# Patient Record
Sex: Female | Born: 1950 | Race: White | Hispanic: No | Marital: Married | Smoking: Never smoker
Health system: Southern US, Community
[De-identification: ages and names within clinical notes are randomized; demographics above are authoritative.]

## PROBLEM LIST (undated history)

## (undated) DIAGNOSIS — F419 Anxiety disorder, unspecified: Secondary | ICD-10-CM

## (undated) DIAGNOSIS — K219 Gastro-esophageal reflux disease without esophagitis: Secondary | ICD-10-CM

## (undated) DIAGNOSIS — M1990 Unspecified osteoarthritis, unspecified site: Secondary | ICD-10-CM

## (undated) DIAGNOSIS — J45909 Unspecified asthma, uncomplicated: Secondary | ICD-10-CM

---

## 1988-10-09 HISTORY — PX: BUNIONECTOMY: SHX129

## 1991-10-10 HISTORY — PX: HAMMER TOE SURGERY: SHX385

## 1994-10-09 HISTORY — PX: FOOT SURGERY: SHX648

## 1996-10-09 HISTORY — PX: ABDOMINAL HYSTERECTOMY: SUR658

## 1998-08-12 ENCOUNTER — Other Ambulatory Visit: Admission: RE | Admit: 1998-08-12 | Discharge: 1998-08-12 | Payer: Self-pay | Admitting: Vascular Surgery

## 1999-08-08 ENCOUNTER — Other Ambulatory Visit: Admission: RE | Admit: 1999-08-08 | Discharge: 1999-08-08 | Payer: Self-pay | Admitting: Orthopedic Surgery

## 2000-01-27 ENCOUNTER — Encounter: Admission: RE | Admit: 2000-01-27 | Discharge: 2000-01-27 | Payer: Self-pay | Admitting: *Deleted

## 2000-10-15 ENCOUNTER — Inpatient Hospital Stay (HOSPITAL_COMMUNITY): Admission: EM | Admit: 2000-10-15 | Discharge: 2000-10-19 | Payer: Self-pay | Admitting: Emergency Medicine

## 2000-10-15 ENCOUNTER — Encounter (HOSPITAL_BASED_OUTPATIENT_CLINIC_OR_DEPARTMENT_OTHER): Payer: Self-pay | Admitting: Internal Medicine

## 2000-10-16 ENCOUNTER — Encounter (HOSPITAL_BASED_OUTPATIENT_CLINIC_OR_DEPARTMENT_OTHER): Payer: Self-pay | Admitting: Internal Medicine

## 2000-10-17 ENCOUNTER — Encounter (HOSPITAL_BASED_OUTPATIENT_CLINIC_OR_DEPARTMENT_OTHER): Payer: Self-pay | Admitting: Internal Medicine

## 2000-10-18 ENCOUNTER — Encounter (HOSPITAL_BASED_OUTPATIENT_CLINIC_OR_DEPARTMENT_OTHER): Payer: Self-pay | Admitting: Internal Medicine

## 2001-02-20 ENCOUNTER — Encounter: Admission: RE | Admit: 2001-02-20 | Discharge: 2001-02-20 | Payer: Self-pay | Admitting: *Deleted

## 2001-10-09 HISTORY — PX: GALLBLADDER SURGERY: SHX652

## 2010-10-09 HISTORY — PX: KNEE ARTHROPLASTY: SHX992

## 2013-10-09 HISTORY — PX: KNEE ARTHROPLASTY: SHX992

## 2019-10-10 DIAGNOSIS — C801 Malignant (primary) neoplasm, unspecified: Secondary | ICD-10-CM

## 2019-10-10 HISTORY — DX: Malignant (primary) neoplasm, unspecified: C80.1

## 2019-10-10 HISTORY — PX: BREAST LUMPECTOMY WITH AXILLARY LYMPH NODE DISSECTION: SHX5756

## 2022-02-17 ENCOUNTER — Other Ambulatory Visit: Payer: Self-pay | Admitting: Orthopedic Surgery

## 2022-03-03 NOTE — Patient Instructions (Addendum)
DUE TO COVID-19 ONLY TWO VISITORS  (aged 70 and older)  ARE ALLOWED TO COME WITH YOU AND STAY IN THE WAITING ROOM ONLY DURING PRE OP AND PROCEDURE.   **NO VISITORS ARE ALLOWED IN THE SHORT STAY AREA OR RECOVERY ROOM!!**  IF YOU WILL BE ADMITTED INTO THE HOSPITAL YOU ARE ALLOWED ONLY FOUR SUPPORT PEOPLE DURING VISITATION HOURS ONLY (7 AM -8PM)   The support person(s) must pass our screening, gel in and out, and wear a mask at all times, including in the patient's room. Patients must also wear a mask when staff or their support person are in the room. Visitors GUEST BADGE MUST BE WORN VISIBLY  One adult visitor may remain with you overnight and MUST be in the room by 8 P.M.     Your procedure is scheduled on: 03/09/22   Report to [facility] Main Entrance    Report to  short stay at 5:15 AM   Call this number if you have problems the morning of surgery [phone]   Do not eat food :After Midnight.   After Midnight you may have the following liquids until __4:30_ AM/  DAY OF SURGERY  Water Black Coffee (sugar ok, NO MILK/CREAM OR CREAMERS)  Tea (sugar ok, NO MILK/CREAM OR CREAMERS) regular and decaf                             Plain Jell-O (NO RED)                                           Fruit ices (not with fruit pulp, NO RED)                                     Popsicles (NO RED)                                                                  Juice: apple, WHITE grape, WHITE cranberry Sports drinks like Gatorade (NO RED) Clear broth(vegetable,chicken,beef)                    The day of surgery:  Drink ONE (1) Pre-Surgery Clear Ensure  at  4:15 AM the morning of surgery. Drink in one sitting. Do not sip.  This drink was given to you during your hospital  pre-op appointment visit. Nothing else to drink after completing the  Pre-Surgery Clear Ensure at 4:30  am          If you have questions, please contact your surgeon's office.   FOLLOW BOWEL PREP AND ANY  ADDITIONAL PRE OP INSTRUCTIONS YOU RECEIVED FROM YOUR SURGEON'S OFFICE!!!     Oral Hygiene is also important to reduce your risk of infection.                                    Remember - BRUSH YOUR TEETH THE MORNING OF SURGERY WITH YOUR REGULAR TOOTHPASTE   Do  NOT smoke after Midnight   Take these medicines the morning of surgery with A SIP OF WATER: Clonazepam if needed, Gabapentin, Venlafaxine, Propranolol, Omeprazole                                You may not have any metal on your body including hair pins, jewelry, and body piercing             Do not wear make-up, lotions, powders, perfumes/cologne, or deodorant  Do not wear nail polish including gel and S&S, artificial/acrylic nails, or any other type of covering on natural nails including finger and toenails. If you have artificial nails, gel coating, etc. that needs to be removed by a nail salon please have this removed prior to surgery or surgery may need to be canceled/ delayed if the surgeon/ anesthesia feels like they are unable to be safely monitored.   Do not shave  48 hours prior to surgery.               Men may shave face and neck.   Do not bring valuables to the hospital. [place].   Contacts, dentures or bridgework may not be worn into surgery. .    Patients discharged on the day of surgery will not be allowed to drive home.  Someone NEEDS to stay with you for the first 24 hours after anesthesia.   Special Instructions: Bring a copy of your healthcare power of attorney and living will documents  the day of surgery if you haven't scanned them before.              Please read over the following fact sheets you were given: IF YOU HAVE QUESTIONS ABOUT YOUR PRE-OP INSTRUCTIONS PLEASE CALL [phone]     [facility] - Preparing for Surgery Before surgery, you can play an important role.  Because skin is not sterile, your skin needs to be as free of germs as  possible.  You can reduce the number of germs on your skin by washing with CHG (chlorahexidine gluconate) soap before surgery.  CHG is an antiseptic cleaner which kills germs and bonds with the skin to continue killing germs even after washing. Please DO NOT use if you have an allergy to CHG or antibacterial soaps.  If your skin becomes reddened/irritated stop using the CHG and inform your nurse when you arrive at Short Stay. Do not shave (including legs and underarms) for at least 48 hours prior to the first CHG shower.   Please follow these instructions carefully:  1.  Shower with CHG Soap the night before surgery and the  morning of Surgery.  2.  If you choose to wash your hair, wash your hair first as usual with your  normal  shampoo.  3.  After you shampoo, rinse your hair and body thoroughly to remove the  shampoo.                            4.  Use CHG as you would any other liquid soap.  You can apply chg directly  to the skin and wash                       Gently with a scrungie or clean  washcloth.  5.  Apply the CHG Soap to your body ONLY FROM THE NECK DOWN.   Do not use on face/ open                           Wound or open sores. Avoid contact with eyes, ears mouth and genitals (private parts).                       Wash face,  Genitals (private parts) with your normal soap.             6.  Wash thoroughly, paying special attention to the area where your surgery  will be performed.  7.  Thoroughly rinse your body with warm water from the neck down.  8.  DO NOT shower/wash with your normal soap after using and rinsing off  the CHG Soap.                9.  Pat yourself dry with a clean towel.            10.  Wear clean pajamas.            11.  Place clean sheets on your bed the night of your first shower and do not  sleep with pets. Day of Surgery : Do not apply any lotions/deodorants the morning of surgery.  Please wear clean clothes to the hospital/surgery center.  FAILURE TO FOLLOW  THESE INSTRUCTIONS MAY RESULT IN THE CANCELLATION OF YOUR SURGERY PATIENT SIGNATURE_________________________________  NURSE SIGNATURE__________________________________  ________________________________________________________________________   Joanne Spencer  An incentive spirometer is a tool that can help keep your lungs clear and active. This tool measures how well you are filling your lungs with each breath. Taking long deep breaths may help reverse or decrease the chance of developing breathing (pulmonary) problems (especially infection) following: A long period of time when you are unable to move or be active. BEFORE THE PROCEDURE  If the spirometer includes an indicator to show your best effort, your nurse or respiratory therapist will set it to a desired goal. If possible, sit up straight or lean slightly forward. Try not to slouch. Hold the incentive spirometer in an upright position. INSTRUCTIONS FOR USE  Sit on the edge of your bed if possible, or sit up as far as you can in bed or on a chair. Hold the incentive spirometer in an upright position. Breathe out normally. Place the mouthpiece in your mouth and seal your lips tightly around it. Breathe in slowly and as deeply as possible, raising the piston or the ball toward the top of the column. Hold your breath for 3-5 seconds or for as long as possible. Allow the piston or ball to fall to the bottom of the column. Remove the mouthpiece from your mouth and breathe out normally. Rest for a few seconds and repeat Steps 1 through 7 at least 10 times every 1-2 hours when you are awake. Take your time and take a few normal breaths between deep breaths. The spirometer may include an indicator to show your best effort. Use the indicator as a goal to work toward during each repetition. After each set of 10 deep breaths, practice coughing to be sure your lungs are clear. If you have an incision (the cut made at the time of surgery),  support your incision when coughing by placing a pillow or rolled up towels firmly against it. Once you are  able to get out of bed, walk around indoors and cough well. You may stop using the incentive spirometer when instructed by your caregiver.  RISKS AND COMPLICATIONS Take your time so you do not get dizzy or light-headed. If you are in pain, you may need to take or ask for pain medication before doing incentive spirometry. It is harder to take a deep breath if you are having pain. AFTER USE Rest and breathe slowly and easily. It can be helpful to keep track of a log of your progress. Your caregiver can provide you with a simple table to help with this. If you are using the spirometer at home, follow these instructions: [place] IF:  You are having difficultly using the spirometer. You have trouble using the spirometer as often as instructed. Your pain medication is not giving enough relief while using the spirometer. You develop fever of 100.5 F (38.1 C) or higher. SEEK IMMEDIATE MEDICAL CARE IF:  You cough up bloody sputum that had not been present before. You develop fever of 102 F (38.9 C) or greater. You develop worsening pain at or near the incision site. MAKE SURE YOU:  Understand these instructions. Will watch your condition. Will get help right away if you are not doing well or get worse. Document Released: 02/05/2007 Document Revised: 12/18/2011 Document Reviewed: 04/08/2007 ExitCare Patient Information 2014 [employer].   ________________________________________________________________________ [facility]    Before surgery, you can play an important role. Because skin is not sterile, your skin needs to be as free of germs as possible. You can reduce the number of germs on your skin by using the following products. Benzoyl Peroxide Gel Reduces the number of germs present on the skin Applied twice a day to  shoulder area starting two days before surgery    ==================================================================  Please follow these instructions carefully:  BENZOYL PEROXIDE 5% GEL  Please do not use if you have an allergy to benzoyl peroxide.   If your skin becomes reddened/irritated stop using the benzoyl peroxide.  Starting two days before surgery, apply as follows: Apply benzoyl peroxide in the morning and at night. Apply after taking a shower. If you are not taking a shower clean entire shoulder front, back, and side along with the armpit with a clean wet washcloth.  Place a quarter-sized dollop on your shoulder and rub in thoroughly, making sure to cover the front, back, and side of your shoulder, along with the armpit.   2 days before ____ AM   ____ PM              1 day before ____ AM   ____ PM                         Do this twice a day for two days.  (Last application is the night before surgery, AFTER using the CHG soap as described below).  Do NOT apply benzoyl peroxide gel on the day of surgery.

## 2022-03-07 ENCOUNTER — Encounter (HOSPITAL_COMMUNITY)
Admission: RE | Admit: 2022-03-07 | Discharge: 2022-03-07 | Disposition: A | Discharge status: Home / Self Care | Payer: Medicare PPO | Source: Ambulatory Visit | Attending: Orthopedic Surgery | Admitting: Orthopedic Surgery

## 2022-03-07 ENCOUNTER — Encounter (HOSPITAL_COMMUNITY): Payer: Self-pay | Admitting: *Deleted

## 2022-03-07 ENCOUNTER — Ambulatory Visit (HOSPITAL_COMMUNITY)
Admission: RE | Admit: 2022-03-07 | Discharge: 2022-03-07 | Disposition: A | Discharge status: Home / Self Care | Payer: Medicare PPO | Source: Ambulatory Visit | Attending: Orthopedic Surgery | Admitting: Orthopedic Surgery

## 2022-03-07 ENCOUNTER — Other Ambulatory Visit: Payer: Self-pay

## 2022-03-07 VITALS — BP 140/92 | HR 59 | Temp 98.4°F | Resp 18 | Ht 64.0 in | Wt 217.0 lb

## 2022-03-07 DIAGNOSIS — Z01818 Encounter for other preprocedural examination: Secondary | ICD-10-CM

## 2022-03-07 HISTORY — DX: Unspecified osteoarthritis, unspecified site: M19.90

## 2022-03-07 HISTORY — DX: Gastro-esophageal reflux disease without esophagitis: K21.9

## 2022-03-07 HISTORY — DX: Anxiety disorder, unspecified: F41.9

## 2022-03-07 HISTORY — DX: Unspecified asthma, uncomplicated: J45.909

## 2022-03-07 LAB — CBC
HCT: 48 % — ABNORMAL HIGH (ref 36.0–46.0)
Hemoglobin: 16 g/dL — ABNORMAL HIGH (ref 12.0–15.0)
MCH: 30 pg (ref 26.0–34.0)
MCHC: 33.3 g/dL (ref 30.0–36.0)
MCV: 89.9 fL (ref 80.0–100.0)
Platelets: 254 10*3/uL (ref 150–400)
RBC: 5.34 MIL/uL — ABNORMAL HIGH (ref 3.87–5.11)
RDW: 14.2 % (ref 11.5–15.5)
WBC: 5.5 10*3/uL (ref 4.0–10.5)
nRBC: 0 % (ref 0.0–0.2)

## 2022-03-07 LAB — BASIC METABOLIC PANEL
Anion gap: 8 (ref 5–15)
BUN: 18 mg/dL (ref 8–23)
CO2: 27 mmol/L (ref 22–32)
Calcium: 9.9 mg/dL (ref 8.9–10.3)
Chloride: 107 mmol/L (ref 98–111)
Creatinine, Ser: 0.58 mg/dL (ref 0.44–1.00)
GFR, Estimated: >60 mL/min (ref >60)
Glucose, Bld: 96 mg/dL (ref 70–99)
Potassium: 4 mmol/L (ref 3.5–5.1)
Sodium: 142 mmol/L (ref 135–145)

## 2022-03-07 LAB — SURGICAL PCR SCREEN
MRSA, PCR: NEGATIVE
Staphylococcus aureus: NEGATIVE

## 2022-03-07 NOTE — Progress Notes (Signed)
Anesthesia note:  Bowel prep reminder:NA  PCP - Alvera Novel NP Cardiologist -none Other-   Chest x-ray - no EKG - 03/07/22-chart Stress Test - no ECHO - no Cardiac Cath - NA  Pacemaker/ICD device last checked:NA  Sleep Study - yes CPAP - yes  Pt is pre diabetic-NA Fasting Blood Sugar -  Checks Blood Sugar _____  Blood Thinner:NA Blood Thinner Instructions: Aspirin Instructions: Last Dose:  Anesthesia review: no   Patient denies shortness of breath, fever, cough and chest pain at PAT appointment Pt has mild asthma but reports no SOB with activities. She has a fear of beting awake during anesthesia because it happened to her during a C-sec.  Patient verbalized understanding of instructions that were given to them at the PAT appointment. Patient was also instructed that they will need to review over the PAT instructions again at home before surgery. yes

## 2022-03-08 NOTE — Anesthesia Preprocedure Evaluation (Signed)
Anesthesia Evaluation  Patient identified by MRN, date of birth, ID band Patient awake    Reviewed: Allergy & Precautions, NPO status , Patient's Chart, lab work & pertinent test results  History of Anesthesia Complications Negative for: history of anesthetic complications  Airway Mallampati: II  TM Distance: >3 FB Neck ROM: Full    Dental no notable dental hx. (+) Dental Advisory Given   Pulmonary asthma ,    Pulmonary exam normal        Cardiovascular hypertension, Pt. on medications and Pt. on home beta blockers Normal cardiovascular exam     Neuro/Psych PSYCHIATRIC DISORDERS Anxiety negative neurological ROS     GI/Hepatic Neg liver ROS, GERD  Medicated,  Endo/Other  negative endocrine ROS  Renal/GU negative Renal ROS     Musculoskeletal  (+) Arthritis ,   Abdominal   Peds  Hematology negative hematology ROS (+)   Anesthesia Other Findings   Reproductive/Obstetrics                            Anesthesia Physical Anesthesia Plan  ASA: 2  Anesthesia Plan: General   Post-op Pain Management: Regional block*, Celebrex PO (pre-op)* and Tylenol PO (pre-op)*   Induction: Intravenous  PONV Risk Score and Plan: 3 and Ondansetron, Dexamethasone and Midazolam  Airway Management Planned: Oral ETT  Additional Equipment:   Intra-op Plan:   Post-operative Plan: Extubation in OR  Informed Consent: I have reviewed the patients History and Physical, chart, labs and discussed the procedure including the risks, benefits and alternatives for the proposed anesthesia with the patient or authorized representative who has indicated his/her understanding and acceptance.     Dental advisory given  Plan Discussed with: Anesthesiologist and CRNA  Anesthesia Plan Comments:        Anesthesia Quick Evaluation

## 2022-03-09 ENCOUNTER — Other Ambulatory Visit: Payer: Self-pay

## 2022-03-09 ENCOUNTER — Ambulatory Visit (HOSPITAL_COMMUNITY): Payer: Medicare PPO | Admitting: Certified Registered Nurse Anesthetist

## 2022-03-09 ENCOUNTER — Encounter (HOSPITAL_COMMUNITY)
Admission: RE | Disposition: A | Discharge status: Home / Self Care | Payer: Self-pay | Source: Home / Self Care | Attending: Orthopedic Surgery

## 2022-03-09 ENCOUNTER — Encounter (HOSPITAL_COMMUNITY): Payer: Self-pay | Admitting: Orthopedic Surgery

## 2022-03-09 ENCOUNTER — Ambulatory Visit (HOSPITAL_COMMUNITY)
Admission: RE | Admit: 2022-03-09 | Discharge: 2022-03-09 | Disposition: A | Discharge status: Home / Self Care | Payer: Medicare PPO | Attending: Orthopedic Surgery | Admitting: Orthopedic Surgery

## 2022-03-09 ENCOUNTER — Ambulatory Visit (HOSPITAL_BASED_OUTPATIENT_CLINIC_OR_DEPARTMENT_OTHER): Payer: Medicare PPO | Admitting: Certified Registered Nurse Anesthetist

## 2022-03-09 DIAGNOSIS — K219 Gastro-esophageal reflux disease without esophagitis: Secondary | ICD-10-CM | POA: Insufficient documentation

## 2022-03-09 DIAGNOSIS — M19012 Primary osteoarthritis, left shoulder: Secondary | ICD-10-CM | POA: Insufficient documentation

## 2022-03-09 DIAGNOSIS — I10 Essential (primary) hypertension: Secondary | ICD-10-CM | POA: Insufficient documentation

## 2022-03-09 DIAGNOSIS — M19011 Primary osteoarthritis, right shoulder: Secondary | ICD-10-CM | POA: Diagnosis not present

## 2022-03-09 HISTORY — PX: TOTAL SHOULDER ARTHROPLASTY: SHX126

## 2022-03-09 LAB — TYPE AND SCREEN
ABO/RH(D): A POS
Antibody Screen: NEGATIVE

## 2022-03-09 LAB — ABO/RH: ABO/RH(D): A POS

## 2022-03-09 SURGERY — ARTHROPLASTY, SHOULDER, TOTAL
Anesthesia: General | Site: Shoulder | Laterality: Left

## 2022-03-09 MED ORDER — ONDANSETRON HCL 4 MG/2ML IJ SOLN
INTRAMUSCULAR | Status: AC
  Filled 2022-03-09: qty 2

## 2022-03-09 MED ORDER — METHOCARBAMOL 500 MG PO TABS: 500.0000 mg | ORAL_TABLET | Freq: Four times a day (QID) | ORAL | Status: DC | PRN

## 2022-03-09 MED ORDER — BUPIVACAINE LIPOSOME 1.3 % IJ SUSP
INTRAMUSCULAR | Status: DC | PRN
  Administered 2022-03-09: 10 mL via PERINEURAL

## 2022-03-09 MED ORDER — OXYCODONE-ACETAMINOPHEN 5-325 MG PO TABS: 1.0000 | ORAL_TABLET | Freq: Four times a day (QID) | ORAL | 0 refills | Status: AC | PRN

## 2022-03-09 MED ORDER — SCOPOLAMINE 1 MG/3DAYS TD PT72
1.0000 | MEDICATED_PATCH | TRANSDERMAL | Status: DC
  Administered 2022-03-09: 1.5 mg via TRANSDERMAL
  Filled 2022-03-09: qty 1

## 2022-03-09 MED ORDER — SODIUM CHLORIDE 0.9 % IR SOLN
Status: DC | PRN
  Administered 2022-03-09: 1000 mL

## 2022-03-09 MED ORDER — FENTANYL CITRATE PF 50 MCG/ML IJ SOSY
25.0000 ug | PREFILLED_SYRINGE | INTRAMUSCULAR | Status: DC | PRN
  Administered 2022-03-09 (×2): 25 ug via INTRAVENOUS

## 2022-03-09 MED ORDER — TRANEXAMIC ACID-NACL 1000-0.7 MG/100ML-% IV SOLN
1000.0000 mg | INTRAVENOUS | Status: AC
  Administered 2022-03-09: 1000 mg via INTRAVENOUS
  Filled 2022-03-09: qty 100

## 2022-03-09 MED ORDER — 0.9 % SODIUM CHLORIDE (POUR BTL) OPTIME
TOPICAL | Status: DC | PRN
  Administered 2022-03-09: 1000 mL

## 2022-03-09 MED ORDER — ORAL CARE MOUTH RINSE: 15.0000 mL | Freq: Once | OROMUCOSAL | Status: AC

## 2022-03-09 MED ORDER — MIDAZOLAM HCL 2 MG/2ML IJ SOLN
INTRAMUSCULAR | Status: AC
  Filled 2022-03-09: qty 2

## 2022-03-09 MED ORDER — CEFAZOLIN SODIUM-DEXTROSE 2-4 GM/100ML-% IV SOLN
2.0000 g | INTRAVENOUS | Status: AC
  Administered 2022-03-09: 2 g via INTRAVENOUS
  Filled 2022-03-09: qty 100

## 2022-03-09 MED ORDER — ROCURONIUM BROMIDE 10 MG/ML (PF) SYRINGE
PREFILLED_SYRINGE | INTRAVENOUS | Status: DC | PRN
  Administered 2022-03-09: 80 mg via INTRAVENOUS

## 2022-03-09 MED ORDER — TRIAMCINOLONE ACETONIDE 40 MG/ML IJ SUSP
INTRAMUSCULAR | Status: AC
  Filled 2022-03-09: qty 1

## 2022-03-09 MED ORDER — CHLORHEXIDINE GLUCONATE 0.12 % MT SOLN
15.0000 mL | Freq: Once | OROMUCOSAL | Status: AC
  Administered 2022-03-09: 15 mL via OROMUCOSAL

## 2022-03-09 MED ORDER — ONDANSETRON HCL 4 MG/2ML IJ SOLN
INTRAMUSCULAR | Status: DC | PRN
  Administered 2022-03-09: 4 mg via INTRAVENOUS

## 2022-03-09 MED ORDER — PROPOFOL 10 MG/ML IV BOLUS
INTRAVENOUS | Status: AC
  Filled 2022-03-09: qty 20

## 2022-03-09 MED ORDER — CELECOXIB 200 MG PO CAPS
200.0000 mg | ORAL_CAPSULE | Freq: Once | ORAL | Status: AC
  Administered 2022-03-09: 200 mg via ORAL
  Filled 2022-03-09: qty 1

## 2022-03-09 MED ORDER — FENTANYL CITRATE PF 50 MCG/ML IJ SOSY
PREFILLED_SYRINGE | INTRAMUSCULAR | Status: AC
  Filled 2022-03-09: qty 1

## 2022-03-09 MED ORDER — FENTANYL CITRATE (PF) 100 MCG/2ML IJ SOLN
INTRAMUSCULAR | Status: AC
  Filled 2022-03-09: qty 2

## 2022-03-09 MED ORDER — SUGAMMADEX SODIUM 200 MG/2ML IV SOLN
INTRAVENOUS | Status: DC | PRN
  Administered 2022-03-09: 300 mg via INTRAVENOUS

## 2022-03-09 MED ORDER — MIDAZOLAM HCL 5 MG/5ML IJ SOLN
INTRAMUSCULAR | Status: DC | PRN
  Administered 2022-03-09: 2 mg via INTRAVENOUS

## 2022-03-09 MED ORDER — ACETAMINOPHEN 500 MG PO TABS
1000.0000 mg | ORAL_TABLET | Freq: Once | ORAL | Status: AC
  Administered 2022-03-09: 1000 mg via ORAL
  Filled 2022-03-09: qty 2

## 2022-03-09 MED ORDER — BUPIVACAINE HCL (PF) 0.5 % IJ SOLN
INTRAMUSCULAR | Status: DC | PRN
  Administered 2022-03-09: 15 mL via PERINEURAL

## 2022-03-09 MED ORDER — PHENYLEPHRINE HCL-NACL 20-0.9 MG/250ML-% IV SOLN
INTRAVENOUS | Status: DC | PRN
  Administered 2022-03-09: 25 ug/min via INTRAVENOUS

## 2022-03-09 MED ORDER — METHOCARBAMOL 500 MG IVPB - SIMPLE MED
500.0000 mg | Freq: Four times a day (QID) | INTRAVENOUS | Status: DC | PRN
  Administered 2022-03-09: 500 mg via INTRAVENOUS

## 2022-03-09 MED ORDER — PROPOFOL 10 MG/ML IV BOLUS
INTRAVENOUS | Status: DC | PRN
  Administered 2022-03-09: 160 mg via INTRAVENOUS

## 2022-03-09 MED ORDER — TIZANIDINE HCL 2 MG PO TABS: 2.0000 mg | ORAL_TABLET | Freq: Three times a day (TID) | ORAL | 0 refills | Status: AC | PRN

## 2022-03-09 MED ORDER — AMISULPRIDE (ANTIEMETIC) 5 MG/2ML IV SOLN: 10.0000 mg | Freq: Once | INTRAVENOUS | Status: DC | PRN

## 2022-03-09 MED ORDER — LIDOCAINE HCL (PF) 2 % IJ SOLN
INTRAMUSCULAR | Status: AC
  Filled 2022-03-09: qty 5

## 2022-03-09 MED ORDER — PHENYLEPHRINE HCL-NACL 20-0.9 MG/250ML-% IV SOLN
INTRAVENOUS | Status: AC
  Filled 2022-03-09: qty 500

## 2022-03-09 MED ORDER — BUPIVACAINE HCL (PF) 0.25 % IJ SOLN
INTRAMUSCULAR | Status: DC | PRN
  Administered 2022-03-09: 7 mL

## 2022-03-09 MED ORDER — ROCURONIUM BROMIDE 10 MG/ML (PF) SYRINGE
PREFILLED_SYRINGE | INTRAVENOUS | Status: AC
  Filled 2022-03-09: qty 10

## 2022-03-09 MED ORDER — BUPIVACAINE HCL (PF) 0.25 % IJ SOLN
INTRAMUSCULAR | Status: AC
  Filled 2022-03-09: qty 10

## 2022-03-09 MED ORDER — PROMETHAZINE HCL 25 MG/ML IJ SOLN: 6.2500 mg | INTRAMUSCULAR | Status: DC | PRN

## 2022-03-09 MED ORDER — TRIAMCINOLONE ACETONIDE 40 MG/ML IJ SUSP
INTRAMUSCULAR | Status: DC | PRN
  Administered 2022-03-09: 40 mg

## 2022-03-09 MED ORDER — WATER FOR IRRIGATION, STERILE IR SOLN
Status: DC | PRN
  Administered 2022-03-09: 2000 mL

## 2022-03-09 MED ORDER — SUGAMMADEX SODIUM 500 MG/5ML IV SOLN
INTRAVENOUS | Status: AC
  Filled 2022-03-09: qty 5

## 2022-03-09 MED ORDER — METHOCARBAMOL 500 MG IVPB - SIMPLE MED
INTRAVENOUS | Status: AC
  Filled 2022-03-09: qty 50

## 2022-03-09 MED ORDER — DEXAMETHASONE SODIUM PHOSPHATE 10 MG/ML IJ SOLN
INTRAMUSCULAR | Status: DC | PRN
  Administered 2022-03-09: 5 mg via INTRAVENOUS

## 2022-03-09 MED ORDER — FENTANYL CITRATE (PF) 100 MCG/2ML IJ SOLN
INTRAMUSCULAR | Status: DC | PRN
  Administered 2022-03-09: 100 ug via INTRAVENOUS

## 2022-03-09 MED ORDER — LACTATED RINGERS IV SOLN: INTRAVENOUS | Status: DC

## 2022-03-09 MED ORDER — DEXAMETHASONE SODIUM PHOSPHATE 10 MG/ML IJ SOLN
INTRAMUSCULAR | Status: AC
Start: 2022-03-09 — End: ?
  Filled 2022-03-09: qty 1

## 2022-03-09 SURGICAL SUPPLY — 74 items
AID PSTN UNV HD RSTRNT DISP (MISCELLANEOUS) ×1
BAG COUNTER SPONGE SURGICOUNT (BAG) IMPLANT
BAG SPEC THK2 15X12 ZIP CLS (MISCELLANEOUS) ×1
BAG SPNG CNTER NS LX DISP (BAG)
BAG ZIPLOCK 12X15 (MISCELLANEOUS) ×3 IMPLANT
BIT DRILL 1.6MX128 (BIT) ×3 IMPLANT
BLADE SAW SAG 73X25 THK (BLADE) ×1
BLADE SAW SGTL 73X25 THK (BLADE) ×2 IMPLANT
CEMENT BONE DEPUY (Cement) ×3 IMPLANT
COOLER ICEMAN CLASSIC (MISCELLANEOUS) ×1 IMPLANT
COVER BACK TABLE 60X90IN (DRAPES) ×3 IMPLANT
COVER SURGICAL LIGHT HANDLE (MISCELLANEOUS) ×3 IMPLANT
DRAPE INCISE IOBAN 66X45 STRL (DRAPES) ×3 IMPLANT
DRAPE ORTHO SPLIT 77X108 STRL (DRAPES) ×4
DRAPE POUCH INSTRU U-SHP 10X18 (DRAPES) ×3 IMPLANT
DRAPE SURG 17X11 SM STRL (DRAPES) ×3 IMPLANT
DRAPE SURG ORHT 6 SPLT 77X108 (DRAPES) ×4 IMPLANT
DRAPE TOP 10253 STERILE (DRAPES) ×3 IMPLANT
DRAPE U-SHAPE 47X51 STRL (DRAPES) ×3 IMPLANT
DRSG AQUACEL AG ADV 3.5X 6 (GAUZE/BANDAGES/DRESSINGS) ×3 IMPLANT
DURAPREP 26ML APPLICATOR (WOUND CARE) ×6 IMPLANT
ELECT BLADE TIP CTD 4 INCH (ELECTRODE) ×3 IMPLANT
ELECT REM PT RETURN 15FT ADLT (MISCELLANEOUS) ×3 IMPLANT
GLENOID PEG SHOULDER 40MM SML (Shoulder) IMPLANT
GLOVE BIO SURGEON STRL SZ7 (GLOVE) ×3 IMPLANT
GLOVE BIO SURGEON STRL SZ7.5 (GLOVE) ×3 IMPLANT
GLOVE BIOGEL PI IND STRL 6.5 (GLOVE) ×2 IMPLANT
GLOVE BIOGEL PI IND STRL 7.0 (GLOVE) ×2 IMPLANT
GLOVE BIOGEL PI IND STRL 8 (GLOVE) ×2 IMPLANT
GLOVE BIOGEL PI INDICATOR 6.5 (GLOVE) ×1
GLOVE BIOGEL PI INDICATOR 7.0 (GLOVE) ×1
GLOVE BIOGEL PI INDICATOR 8 (GLOVE) ×1
GLOVE SURG POLYISO LF SZ6.5 (GLOVE) ×3 IMPLANT
GOWN STRL REUS W/ TWL XL LVL3 (GOWN DISPOSABLE) ×2 IMPLANT
GOWN STRL REUS W/TWL XL LVL3 (GOWN DISPOSABLE) ×2
GUIDEWIRE GLENOID 2.5X220 (WIRE) ×1 IMPLANT
HANDPIECE INTERPULSE COAX TIP (DISPOSABLE) ×2
HEAD HUMERAL AEQUALIS 48X18 (Head) ×2 IMPLANT
HEAD HUMERAL HIGH OS 48X18 (Head) IMPLANT
HEMOSTAT SURGICEL 2X14 (HEMOSTASIS) ×3 IMPLANT
HOOD PEEL AWAY FLYTE STAYCOOL (MISCELLANEOUS) ×9 IMPLANT
HUMERAL STEM AEQUALIS 3BX74MM (Stem) ×2 IMPLANT
KIT BASIN OR (CUSTOM PROCEDURE TRAY) ×3 IMPLANT
KIT TURNOVER KIT A (KITS) IMPLANT
MANIFOLD NEPTUNE II (INSTRUMENTS) ×3 IMPLANT
NDL TROCAR POINT SZ 2 1/2 (NEEDLE) ×2 IMPLANT
NEEDLE TROCAR POINT SZ 2 1/2 (NEEDLE) ×2 IMPLANT
NS IRRIG 1000ML POUR BTL (IV SOLUTION) ×3 IMPLANT
PACK SHOULDER (CUSTOM PROCEDURE TRAY) ×3 IMPLANT
PAD COLD SHLDR WRAP-ON (PAD) IMPLANT
PAD ORTHO SHOULDER 7X19 LRG (SOFTGOODS) ×1 IMPLANT
PROTECTOR NERVE ULNAR (MISCELLANEOUS) IMPLANT
RESTRAINT HEAD UNIVERSAL NS (MISCELLANEOUS) ×3 IMPLANT
RETRIEVER SUT HEWSON (MISCELLANEOUS) ×3 IMPLANT
SET HNDPC FAN SPRY TIP SCT (DISPOSABLE) ×2 IMPLANT
SHOULDER GLENOID PEG 40MM SML (Shoulder) ×2 IMPLANT
SLING ARM IMMOBILIZER LRG (SOFTGOODS) ×1 IMPLANT
SMARTMIX MINI TOWER (MISCELLANEOUS) ×2
SPONGE T-LAP 18X18 ~~LOC~~+RFID (SPONGE) ×1 IMPLANT
SPONGE T-LAP 4X18 ~~LOC~~+RFID (SPONGE) ×6 IMPLANT
STEM HUMERAL AEQUALIS 3BX74MM (Stem) IMPLANT
STRIP CLOSURE SKIN 1/2X4 (GAUZE/BANDAGES/DRESSINGS) ×3 IMPLANT
SUCTION FRAZIER HANDLE 12FR (TUBING) ×2
SUCTION TUBE FRAZIER 12FR DISP (TUBING) ×2 IMPLANT
SUPPORT WRAP ARM LG (MISCELLANEOUS) ×3 IMPLANT
SUT ETHIBOND 2 V 37 (SUTURE) ×3 IMPLANT
SUT MNCRL AB 4-0 PS2 18 (SUTURE) ×3 IMPLANT
SUT VIC AB 2-0 CT1 27 (SUTURE) ×4
SUT VIC AB 2-0 CT1 TAPERPNT 27 (SUTURE) ×4 IMPLANT
TAPE LABRALWHITE 1.5X36 (TAPE) ×3 IMPLANT
TAPE SUT LABRALTAP WHT/BLK (SUTURE) ×3 IMPLANT
TOWEL OR 17X26 10 PK STRL BLUE (TOWEL DISPOSABLE) ×3 IMPLANT
TOWER SMARTMIX MINI (MISCELLANEOUS) ×2 IMPLANT
WATER STERILE IRR 1000ML POUR (IV SOLUTION) ×3 IMPLANT

## 2022-03-09 NOTE — Anesthesia Procedure Notes (Signed)
Procedure Name: Intubation Date/Time: 03/09/2022 7:43 AM Performed by: West Pugh, CRNA Pre-anesthesia Checklist: Patient identified, Emergency Drugs available, Suction available, Patient being monitored and Timeout performed Patient Re-evaluated:Patient Re-evaluated prior to induction Oxygen Delivery Method: Circle system utilized Preoxygenation: Pre-oxygenation with 100% oxygen Induction Type: IV induction Ventilation: Mask ventilation without difficulty Laryngoscope Size: Mac and 3 Grade View: Grade I Tube type: Oral Tube size: 7.0 mm Number of attempts: 1 Airway Equipment and Method: Stylet Placement Confirmation: ETT inserted through vocal cords under direct vision, positive ETCO2, CO2 detector and breath sounds checked- equal and bilateral Secured at: 22 cm Tube secured with: Tape Dental Injury: Teeth and Oropharynx as per pre-operative assessment

## 2022-03-09 NOTE — Transfer of Care (Signed)
Immediate Anesthesia Transfer of Care Note  Patient: Joanne Spencer  Procedure(s) Performed: TOTAL SHOULDER ARTHROPLASTY, RIGHT SHOULDER INJECTION (Left: Shoulder)  Patient Location: PACU  Anesthesia Type:GA combined with regional for post-op pain  Level of Consciousness: awake, alert  and patient cooperative  Airway & Oxygen Therapy: Patient Spontanous Breathing and Patient connected to face mask oxygen  Post-op Assessment: Report given to RN and Post -op Vital signs reviewed and stable  Post vital signs: Reviewed and stable  Last Vitals:  Vitals Value Taken Time  BP 177/101 03/09/22 0917  Temp    Pulse 72 03/09/22 0921  Resp 22 03/09/22 0921  SpO2 91 % 03/09/22 0921  Vitals shown include unvalidated device data.  Last Pain:  Vitals:   03/09/22 0604  TempSrc:   PainSc: 3       Patients Stated Pain Goal: 4 (72/82/06 0156)  Complications: No notable events documented.

## 2022-03-09 NOTE — Op Note (Signed)
Procedure(s): TOTAL SHOULDER ARTHROPLASTY, RIGHT SHOULDER INJECTION Procedure Note  KALLISTA PAE female 71 y.o. 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 female  With endstage bilateral shoulder arthritis. Pain and dysfunction interfered with quality of life and nonoperative treatment with activity modification, NSAIDS and injections failed.  She was to proceed with left total shoulder replacement and also was due for an injection in the right shoulder to help alleviate pain.     Surgeon: Rhae Hammock   Assistants: Sheryle Hail PA-C Amber was present and scrubbed throughout the procedure and was essential in positioning, retraction, exposure, and closure)  Anesthesia: General endotracheal anesthesia with preoperative interscalene block given by the attending anesthesiologist    Procedure Detail  TOTAL SHOULDER ARTHROPLASTY, RIGHT SHOULDER INJECTION  Findings: Tornier flex anatomic press-fit size 3 stem with a 48 head, cemented size 40 small Cortiloc glenoid.   A lesser tuberosity osteotomy was performed and repaired at the conclusion of the procedure.  Estimated Blood Loss:  200 mL         Drains: None   Blood Given: none          Specimens: none        Complications:  * No complications entered in OR log *         Disposition: PACU - hemodynamically stable.         Condition: stable    Procedure:   The patient was identified in the preoperative holding area where I personally marked the operative extremity after verifying with the patient and consent. She  was taken to the operating room where She was transferred to the   operative table.  The  patient received an interscalene block in   the holding area by the attending anesthesiologist.  General anesthesia was induced   in the operating room without complication.  The patient did receive IV  Ancef prior to the commencement of the procedure.  The patient was   placed in the beach-chair position with the back raised about 30   degrees.  The nonoperative extremity and head and neck were carefully   positioned and padded protecting against neurovascular compromise.  The   left upper extremity was then prepped and draped in the standard sterile   fashion.    The appropriate operative time-out was performed with   Anesthesia, the perioperative staff, as well as myself and we all agreed   that the left side was the correct operative site.  An approximately   10 cm incision was made from the tip of the coracoid to the center point of the   humerus at the level of the axilla.  Dissection was carried down sharply   through subcutaneous tissues and cephalic vein was identified and taken   laterally with the deltoid.  The pectoralis major was taken medially.  The   upper 1 cm of the pectoralis major was released from its attachment on   the humerus.  The clavipectoral fascia was incised just lateral to the   conjoined tendon.  This incision was carried up to but not into the   coracoacromial ligament.  Digital palpation was used to prove   integrity of the axillary nerve which was protected throughout  the   procedure.  Musculocutaneous nerve was not palpated in the operative   field.  Conjoined tendon was then retracted gently medially and the   deltoid laterally.  Anterior circumflex humeral vessels were clamped and   coagulated.  The soft tissues overlying the biceps was incised and this   incision was carried across the transverse humeral ligament to the base   of the coracoid.  The biceps was noted to be severely degenerated. It was released from the superior labrum. The biceps was then  tenodesed to the soft tissue just above   pectoralis major and the remaining portion of the biceps superiorly was   excised.  An osteotomy was performed at the lesser tuberosity.  The capsule was then   released all the way down to the 6 o'clock position of the humeral head.   The humeral head was then delivered with simultaneous adduction,   extension and external rotation.  All humeral osteophytes were removed   and the anatomic neck of the humerus was marked and cut free hand at   approximately 25 degrees retroversion within about 3 mm of the cuff   reflection posteriorly.  The head size was estimated to be a 48 medium   offset.  At that point, the humeral head was retracted posteriorly with   a Fukuda retractor.   Remaining portion of the capsule was released at the base of the   coracoid.  The remaining biceps anchor and the entire anterior-inferior   labrum was excised.  The posterior labrum was also excised but the   posterior capsule was not released.  The guidepin was placed bicortically with non elevated guide.  The reamer was used to ream to concentric bone with punctate bleeding.  This gave an excellent concentric surface.  The center hole was then drilled for an anchor peg glenoid followed by the three peripheral holes and none of the holes   exited the glenoid wall.  I then pulse irrigated these holes and dried   them with Surgicel.  The three peripheral holes were then   pressurized cemented and the anchor peg glenoid was placed and impacted   with an excellent fit.  The glenoid was a 40 small component.  The proximal humerus was then again exposed taking care not to displace the glenoid.    The entry awl was used followed by sounding reamers and then sequentially broached from size 1 to 3. This was then left in place and the calcar planer was used. Trial head was placed with a 48.  With the trial implantation of the component, the there was approximately 50% posterior translation  with immediate snap back to the   anatomic position.  With forward elevation, there was no tendency   towards posterior subluxation.   The trial was removed and the final implant was prepared on a back table.  The trial was removed and the final implant was prepared on a back table.   3 small holes were drilled on the medial side of the lesser tuberosity osteotomy, through which 2 labral tapes were passed. The implant was then placed through the loop of the 2 labral tapes and impacted with an excellent press-fit. This achieved excellent anatomic reconstruction of the proximal humerus.  The joint was then copiously irrigated with pulse lavage.  The subscapularis and   lesser tuberosity osteotomy were then repaired using the 2 labral tapes previously passed in a double row fashion with horizontal mattress sutures medially brought  over through bone tunnels tied over a bone bridge laterally.   One #1 Ethibond was placed at the rotator interval just above   the lesser tuberosity. Copious irrigation was used. Skin was closed with 2-0 Vicryl sutures in the deep dermal layer and 4-0 Monocryl in a subcuticular  running fashion.  Sterile dressings were then applied including Aquacel.  The patient was placed in a sling and allowed to awaken from general anesthesia and taken to the recovery room in stable condition.      POSTOPERATIVE PLAN:  Early passive range of motion will be allowed with the goal of 0 degrees external rotation and 90 degrees forward elevation.  No internal rotation at this time.  No active motion of the arm until the lesser tuberosity heals.  The patient will likely be observed in the recovery room and if her pain is well controlled with the regional block and she is hemodynamically stable she could be discharged home today with family.

## 2022-03-09 NOTE — H&P (Addendum)
Joanne Spencer is an 71 y.o. female.   Chief Complaint: L shoulder pain and dysfunction HPI: Endstage L shoulder arthritis with significant pain and dysfunction, failed conservative measures.  Pain interferes with sleep and quality of life.  Also with associated R shoulder OA.    Past Medical History:  Diagnosis Date   Anxiety    Arthritis    knees. shoulders,back   Asthma    mild   Cancer ([place]) 2021   breast CA on Lt breast   GERD (gastroesophageal reflux disease)     Past Surgical History:  Procedure Laterality Date   ABDOMINAL HYSTERECTOMY  1998   BREAST LUMPECTOMY WITH AXILLARY LYMPH NODE DISSECTION Left 2021   with radiation   BUNIONECTOMY Bilateral [place]   FOOT SURGERY Bilateral 1996   removal of bone in pinky toe   GALLBLADDER SURGERY  2003   HAMMER TOE SURGERY Bilateral 1993   KNEE ARTHROPLASTY Left 2012   KNEE ARTHROPLASTY Right 2015    BMI: Estimated body mass index is 37.25 kg/m as calculated from the following:   Height as of this encounter: '5\' 4"'$  (1.626 m).   Weight as of this encounter: 98.4 kg.  No results found for: ALBUMIN Diabetes: Patient does not have a diagnosis of diabetes.     Smoking Status:   reports that she has never smoked. She has never used smokeless tobacco.   History reviewed. No pertinent family history. Social History:  reports that she has never smoked. She has never used smokeless tobacco. She reports current alcohol use of about 3.0 standard drinks per week. She reports that she does not use drugs.  Allergies:  Allergies  Allergen Reactions   Codeine     Keeps me awake    Medications Prior to Admission  Medication Sig Dispense Refill   acetaminophen (TYLENOL) 325 MG tablet Take 650 mg by mouth every 6 (six) hours as needed for moderate pain.     celecoxib (CELEBREX) 200 MG capsule Take 200 mg by mouth daily as needed for moderate pain.     clonazePAM (KLONOPIN) 0.5 MG  tablet Take 0.5 mg by mouth daily as needed for anxiety.     famotidine (PEPCID) 40 MG tablet Take 40 mg by mouth at bedtime.     fluticasone (FLONASE) 50 MCG/ACT nasal spray Place 2 sprays into both nostrils daily.     gabapentin (NEURONTIN) 300 MG capsule Take 300 mg by mouth daily as needed (pain).     Multiple Vitamin (MULTIVITAMIN WITH MINERALS) TABS tablet Take 2 tablets by mouth daily.     omeprazole (PRILOSEC) 20 MG capsule Take 20 mg by mouth daily.     propranolol (INDERAL) 10 MG tablet Take 10 mg by mouth 2 (two) times daily.     propranolol ER (INDERAL LA) 80 MG 24 hr capsule Take 80 mg by mouth daily.     Specialty Vitamins Products (ONE-A-DAY MENOPAUSE HEALTH PO) Take 1 tablet by mouth daily.     venlafaxine XR (EFFEXOR-XR) 150 MG 24 hr capsule Take 150 mg by mouth daily with breakfast.     hydrochlorothiazide (HYDRODIURIL) 25 MG tablet Take 25 mg by mouth daily as needed (fluid).      Results for orders placed or performed during the hospital encounter of 03/07/22 (from the past 48 hour(s))  Type and screen Order type and screen if day of surgery is less than 15 days from draw  of preadmission visit or order morning of surgery if day of surgery is greater than 6 days from preadmission visit.     Status: None   Collection Time: 03/07/22  2:52 PM  Result Value Ref Range   ABO/RH(D) A POS    Antibody Screen NEG    Sample Expiration 03/21/2022,2359    Extend sample reason      NO TRANSFUSIONS OR PREGNANCY IN THE PAST 3 MONTHS Performed at [facility] [address], [place], [place] [address]   CBC per protocol     Status: Abnormal   Collection Time: 03/07/22  2:58 PM  Result Value Ref Range   WBC 5.5 4.0 - 10.5 K/uL   RBC 5.34 (H) 3.87 - 5.11 MIL/uL   Hemoglobin 16.0 (H) 12.0 - 15.0 g/dL   HCT 48.0 (H) 36.0 - 46.0 %   MCV 89.9 80.0 - 100.0 fL   MCH 30.0 26.0 - 34.0 pg   MCHC 33.3 30.0 - 36.0 g/dL   RDW 14.2 11.5 - 15.5 %   Platelets 254 150 - 400  K/uL   nRBC 0.0 0.0 - 0.2 %    Comment: Performed at [facility], [place] [address] [place], [place] [address]  Basic metabolic panel per protocol     Status: None   Collection Time: 03/07/22  2:58 PM  Result Value Ref Range   Sodium 142 135 - 145 mmol/L   Potassium 4.0 3.5 - 5.1 mmol/L   Chloride 107 98 - 111 mmol/L   CO2 27 22 - 32 mmol/L   Glucose, Bld 96 70 - 99 mg/dL    Comment: Glucose reference range applies only to samples taken after fasting for at least 8 hours.   BUN 18 8 - 23 mg/dL   Creatinine, Ser 0.58 0.44 - 1.00 mg/dL   Calcium 9.9 8.9 - 10.3 mg/dL   GFR, Estimated >60 >60 mL/min    Comment: (NOTE) Calculated using the CKD-EPI Creatinine Equation (2021)    Anion gap 8 5 - 15    Comment: Performed at [facility], [place] [address] [place], [place] [address]  Surgical pcr screen     Status: None   Collection Time: 03/07/22  2:58 PM   Specimen: Nasal Mucosa; Nasal Swab  Result Value Ref Range   MRSA, PCR NEGATIVE NEGATIVE   Staphylococcus aureus NEGATIVE NEGATIVE    Comment: (NOTE) The Xpert SA Assay (FDA approved for NASAL specimens in patients 41 years of age and older), is one component of a comprehensive surveillance program. It is not intended to diagnose infection nor to guide or monitor treatment. Performed at [facility], [place] [address] [place], [place] [address]    DG Chest 2 View  Result Date: 03/08/2022 CLINICAL DATA:  Preoperative chest x-ray. EXAM: CHEST - 2 VIEW COMPARISON:  None Available. FINDINGS: The heart size and mediastinal contours are within normal limits. Both lungs are clear. The visualized skeletal structures are unremarkable. IMPRESSION: No active cardiopulmonary disease. Electronically Signed   By: Dorise Bullion III M.D.   On: 03/08/2022 20:25    Review of Systems  All other systems reviewed and are negative.  Blood pressure (!) 176/95, pulse 65, temperature 98.2 F (36.8  C), temperature source Oral, resp. rate 16, height '5\' 4"'$  (1.626 m), weight 98.4 kg, SpO2 92 %. Physical Exam HENT:     Head: Atraumatic.  Eyes:     Extraocular Movements: Extraocular movements intact.  Cardiovascular:  Pulses: Normal pulses.  Pulmonary:     Effort: Pulmonary effort is normal.  Musculoskeletal:     Comments: Bilateral shoulder pain with limited ROM. L>R  Neurological:     Mental Status: She is alert.     Assessment/Plan L>R shoulder arthritis Plan L shoulder TSA, will also inject R shoulder in the OR Risks / benefits of surgery discussed Consent on chart  NPO for OR Preop antibiotics   Rhae Hammock, MD 03/09/2022, 7:13 AM

## 2022-03-09 NOTE — Anesthesia Postprocedure Evaluation (Signed)
Anesthesia Post Note  Patient: Joanne Spencer  Procedure(s) Performed: TOTAL SHOULDER ARTHROPLASTY, RIGHT SHOULDER INJECTION (Left: Shoulder)     Patient location during evaluation: PACU Anesthesia Type: General Level of consciousness: sedated Pain management: pain level controlled Vital Signs Assessment: post-procedure vital signs reviewed and stable Respiratory status: spontaneous breathing and respiratory function stable Cardiovascular status: stable Postop Assessment: no apparent nausea or vomiting Anesthetic complications: no   No notable events documented.  Last Vitals:  Vitals:   03/09/22 1015 03/09/22 1040  BP: 125/84 (!) 150/84  Pulse: 65 65  Resp: 19 15  Temp: 36.5 C 36.7 C  SpO2: 92% 93%    Last Pain:  Vitals:   03/09/22 1015  TempSrc:   PainSc: 1                  Jaquise Faux DANIEL

## 2022-03-09 NOTE — Evaluation (Signed)
Occupational Therapy Evaluation Patient Details Name: WALDINE ZENZ MRN: [id number] DOB: Jun 30, 1951 Today's Date: 03/09/2022   History of Present Illness Patient s/p left TSA   Clinical Impression   Mrs. Selena Swaminathan is a 71 year old woman s/p shoulder replacement without functional use of right dominant upper extremity secondary to effects of surgery and interscalene block and shoulder precautions. Therapist provided education and instruction to patient and spouse in regards to exercises, precautions, positioning, donning upper extremity clothing and bathing while maintaining shoulder precautions, ice and edema management and donning/doffing sling. Patient and spouse verbalized understanding and demonstrated as needed. Patient needed assistance to donn shirt, underwear, pants, socks and shoes and provided. Patient provided with handouts to maximize retention of education. Patient to follow up with MD for further therapy needs.        Recommendations for follow up therapy are one component of a multi-disciplinary discharge planning process, led by the attending physician.  Recommendations may be updated based on patient status, additional functional criteria and insurance authorization.   Follow Up Recommendations  Follow physician's recommendations for discharge plan and follow up therapies    Assistance Recommended at Discharge Intermittent Supervision/Assistance  Patient can return home with the following A little help with bathing/dressing/bathroom;Assistance with cooking/housework    Functional Status Assessment  Patient has had a recent decline in their functional status and demonstrates the ability to make significant improvements in function in a reasonable and predictable amount of time.  Equipment Recommendations  None recommended by OT    Recommendations for Other Services       Precautions / Restrictions Precautions Precaution Comments: No AROM, PROM FF 0-90,  ER, PROM 0-30  degrees per order. Required Braces or Orthoses: Sling Restrictions Weight Bearing Restrictions: Yes LUE Weight Bearing: Non weight bearing      Mobility Bed Mobility                    Transfers Overall transfer level: Independent                        Balance Overall balance assessment: No apparent balance deficits (not formally assessed)                                         ADL either performed or assessed with clinical judgement   ADL Overall ADL's : Needs assistance/impaired Eating/Feeding: Set up   Grooming: Modified independent   Upper Body Bathing: Moderate assistance;Adhering to UE precautions   Lower Body Bathing: Moderate assistance;Sit to/from stand   Upper Body Dressing : Maximal assistance;Sitting;Adhering to UE precautions   Lower Body Dressing: Moderate assistance;Sit to/from stand   Toilet Transfer: Min guard   Toileting- Water quality scientist and Hygiene: Minimal assistance;Sit to/from stand       Functional mobility during ADLs: Min guard       Vision Baseline Vision/History: 1 Wears glasses       Perception     Praxis      Pertinent Vitals/Pain Pain Assessment Pain Assessment: Faces Faces Pain Scale: Hurts a little bit Pain Location: L shoulder Pain Descriptors / Indicators: Grimacing Pain Intervention(s): Limited activity within patient's tolerance     Hand Dominance Right   Extremity/Trunk Assessment Upper Extremity Assessment Upper Extremity Assessment: LUE deficits/detail LUE Deficits / Details: impaired motor control and sensation secondary to block, limited by shoulder precautions  Lower Extremity Assessment Lower Extremity Assessment: Overall WFL for tasks assessed   Cervical / Trunk Assessment Cervical / Trunk Assessment: Normal   Communication Communication Communication: No difficulties   Cognition Arousal/Alertness: Awake/alert Behavior During Therapy: WFL for tasks  assessed/performed Overall Cognitive Status: Within Functional Limits for tasks assessed                                       General Comments       Exercises     Shoulder Instructions Shoulder Instructions Donning/doffing shirt without moving shoulder: Caregiver independent with task Method for sponge bathing under operated UE: Caregiver independent with task Donning/doffing sling/immobilizer: Caregiver independent with task Correct positioning of sling/immobilizer: Caregiver independent with task ROM for elbow, wrist and digits of operated UE: Independent Sling wearing schedule (on at all times/off for ADL's): Independent Proper positioning of operated UE when showering: Independent Dressing change: Independent Positioning of UE while sleeping: [place] expects to be discharged to:: Private residence Living Arrangements: Spouse/significant other Available Help at Discharge: Family;Available 24 hours/day                   Bathroom Toilet: Standard     Home Equipment: BSC/3in1          Prior Functioning/Environment Prior Level of Function : Independent/Modified Independent                        OT Problem List: Decreased strength;Decreased range of motion;Impaired UE functional use;Pain      OT Treatment/Interventions:      OT Goals(Current goals can be found in the care plan section) Acute Rehab OT Goals OT Goal Formulation: All assessment and education complete, DC therapy  OT Frequency:      Co-evaluation              AM-PAC OT "6 Clicks" Daily Activity     Outcome Measure Help from another person eating meals?: A Little Help from another person taking care of personal grooming?: None Help from another person toileting, which includes using toliet, bedpan, or urinal?: A Little Help from another person bathing (including washing, rinsing, drying)?: A Little Help from another person to  put on and taking off regular upper body clothing?: A Lot Help from another person to put on and taking off regular lower body clothing?: A Lot 6 Click Score: 17   End of Session Nurse Communication:  (OT education complete)  Activity Tolerance: Patient tolerated treatment well Patient left: in chair;with family/visitor present  OT Visit Diagnosis: Pain Pain - Right/Left: Left Pain - part of body: Shoulder                Time: 3794-3276 OT Time Calculation (min): 21 min Charges:  OT General Charges $OT Visit: 1 Visit OT Evaluation $OT Eval Low Complexity: 1 Low  Lucette Kratz, OTR/L [place]  Office [phone] Pager: [place] 03/09/2022, 1:01 PM

## 2022-03-09 NOTE — Anesthesia Procedure Notes (Signed)
Anesthesia Regional Block: Interscalene brachial plexus block   Pre-Anesthetic Checklist: , timeout performed,  Correct Patient, Correct Site, Correct Laterality,  Correct Procedure, Correct Position, site marked,  Risks and benefits discussed,  Surgical consent,  Pre-op evaluation,  At surgeon's request and post-op pain management  Laterality: Left  Prep: chloraprep       Needles:  Injection technique: Single-shot  Needle Type: Echogenic Stimulator Needle     Needle Length: 5cm  Needle Gauge: 22     Additional Needles:   Narrative:  Start time: 03/09/2022 6:44 AM End time: 03/09/2022 6:54 AM Injection made incrementally with aspirations every 5 mL.  Performed by: Personally  Anesthesiologist: Duane Boston, MD  Additional Notes: Functioning IV was confirmed and monitors applied.  A 9m 22ga echogenic arrow stimulator was used. Sterile prep and drape,hand hygiene and sterile gloves were used.Ultrasound guidance: relevant anatomy identified, needle position confirmed, local anesthetic spread visualized around nerve(s)., vascular puncture avoided.  Image printed for medical record.  Negative aspiration and negative test dose prior to incremental administration of local anesthetic. The patient tolerated the procedure well.

## 2022-03-09 NOTE — Discharge Instructions (Signed)
Discharge Instructions after Total Shoulder Arthroplasty   A sling has been provided for you. Remove the sling 5 times each day to perform motion exercises. After the first 48 to 72 hours, discontinue using the sling. You should use the sling as a protective device, if you are in a crowd.  Use ice on the shoulder intermittently over the first 48 hours after surgery.  Pain medication has been prescribed for you.  Use your medication liberally over the first 48 hours, and then begin to taper your use. You may take Extra Strength Tylenol or Tylenol only in place of the pain pills. DO NOT take ANY nonsteroidal anti-inflammatory pain medications: Advil, Motrin, Ibuprofen, Aleve, Naproxen, or Naprosyn. Take one aspirin a day for 2 weeks after surgery, unless you have an aspirin sensitivity/allergy or asthma. Leave your dressing on until your first follow up visit.  You may shower with the dressing.  Hold your arm as if you still have your sling on while you shower. Active reaching and lifting are not permitted. You may use the operative arm for activities of daily living that do not require the operative arm to leave the side of the body, such as eating, drinking, bathing, etc.  Three to 5 times each day you should perform assisted overhead reaching and external rotation (outward turning) exercises with the operative arm. You were taught these exercises prior to discharge. Both exercises should be done with the non-operative arm used as the "therapist arm" while the operative arm remains relaxed. Ten of each exercise should be done three to five times each day.   Overhead reach is helping to lift your stiff arm up as high as it will go. To stretch your overhead reach, lie flat on your back, relax, and grasp the wrist of the tight shoulder with your opposite hand. Using the power in your opposite arm, bring the stiff arm up as far as it is comfortable. Start holding it for ten seconds and then work up to where  you can hold it for a count of 30. Breathe slowly and deeply while the arm is moved. Repeat this stretch ten times, trying to help the ar up a little higher each time.     External rotation is turning the arm out to the side while your elbow stays close to your body. External rotation is best stretched while you are lying on your back. Hold a cane, yardstick, broom handle, or dowel in both hands. Bend both elbows to a right angle. Use steady, gentle force from your normal arm to rotate the hand of the stiff shoulder out away from your body. Continue the rotation until it is straight in front of you holding it there for a count of 10. Do not go beyond this level of rotation until seen back by Dr. Chandler. Repeat this exercise ten times slowly.      Please call [phone] during normal business hours or [phone] after hours for any problems. Including the following:  - excessive redness of the incisions - drainage for more than 4 days - fever of more than 101.5 F  *Please note that pain medications will not be refilled after hours or on weekends.   Dental Antibiotics:  In most cases prophylactic antibiotics for Dental procdeures after total joint surgery are not necessary.  Exceptions are as follows:  1. History of prior total joint infection  2. Severely immunocompromised (Organ Transplant, cancer chemotherapy, Rheumatoid biologic meds such as Humera)  3. Poorly controlled   diabetes (A1C &gt; 8.0, blood glucose over 200)  If you have one of these conditions, contact your surgeon for an antibiotic prescription, prior to your dental procedure.  

## 2022-03-10 ENCOUNTER — Encounter (HOSPITAL_COMMUNITY): Payer: Self-pay | Admitting: Orthopedic Surgery

## 2022-09-13 IMAGING — DX DG CHEST 2V
2 series · 2 of 2 positions shown · non-contrast
Comparison: None Available.

CLINICAL DATA: Preoperative chest x-ray.

EXAM:
CHEST - 2 VIEW

[chest pa]
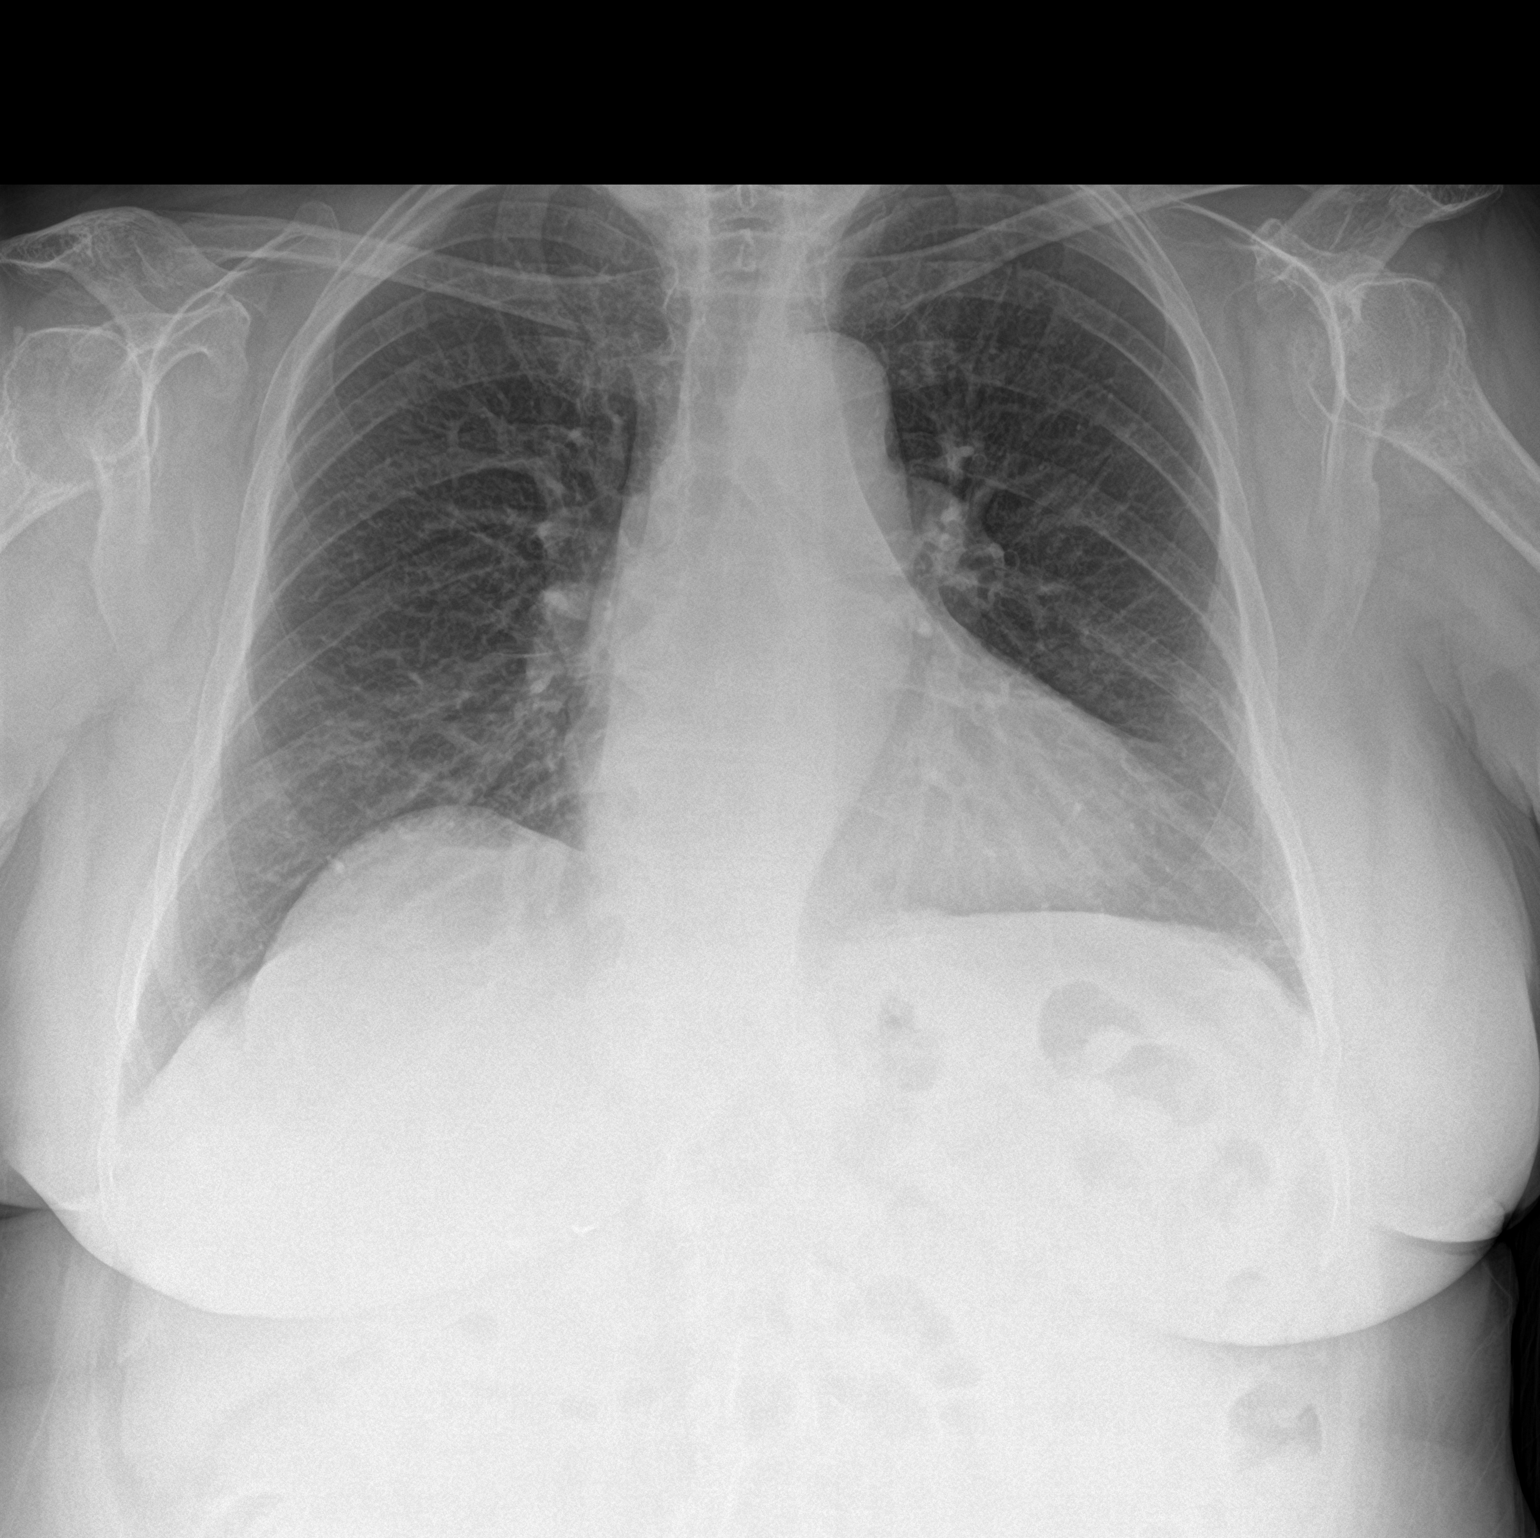

[chest lat]
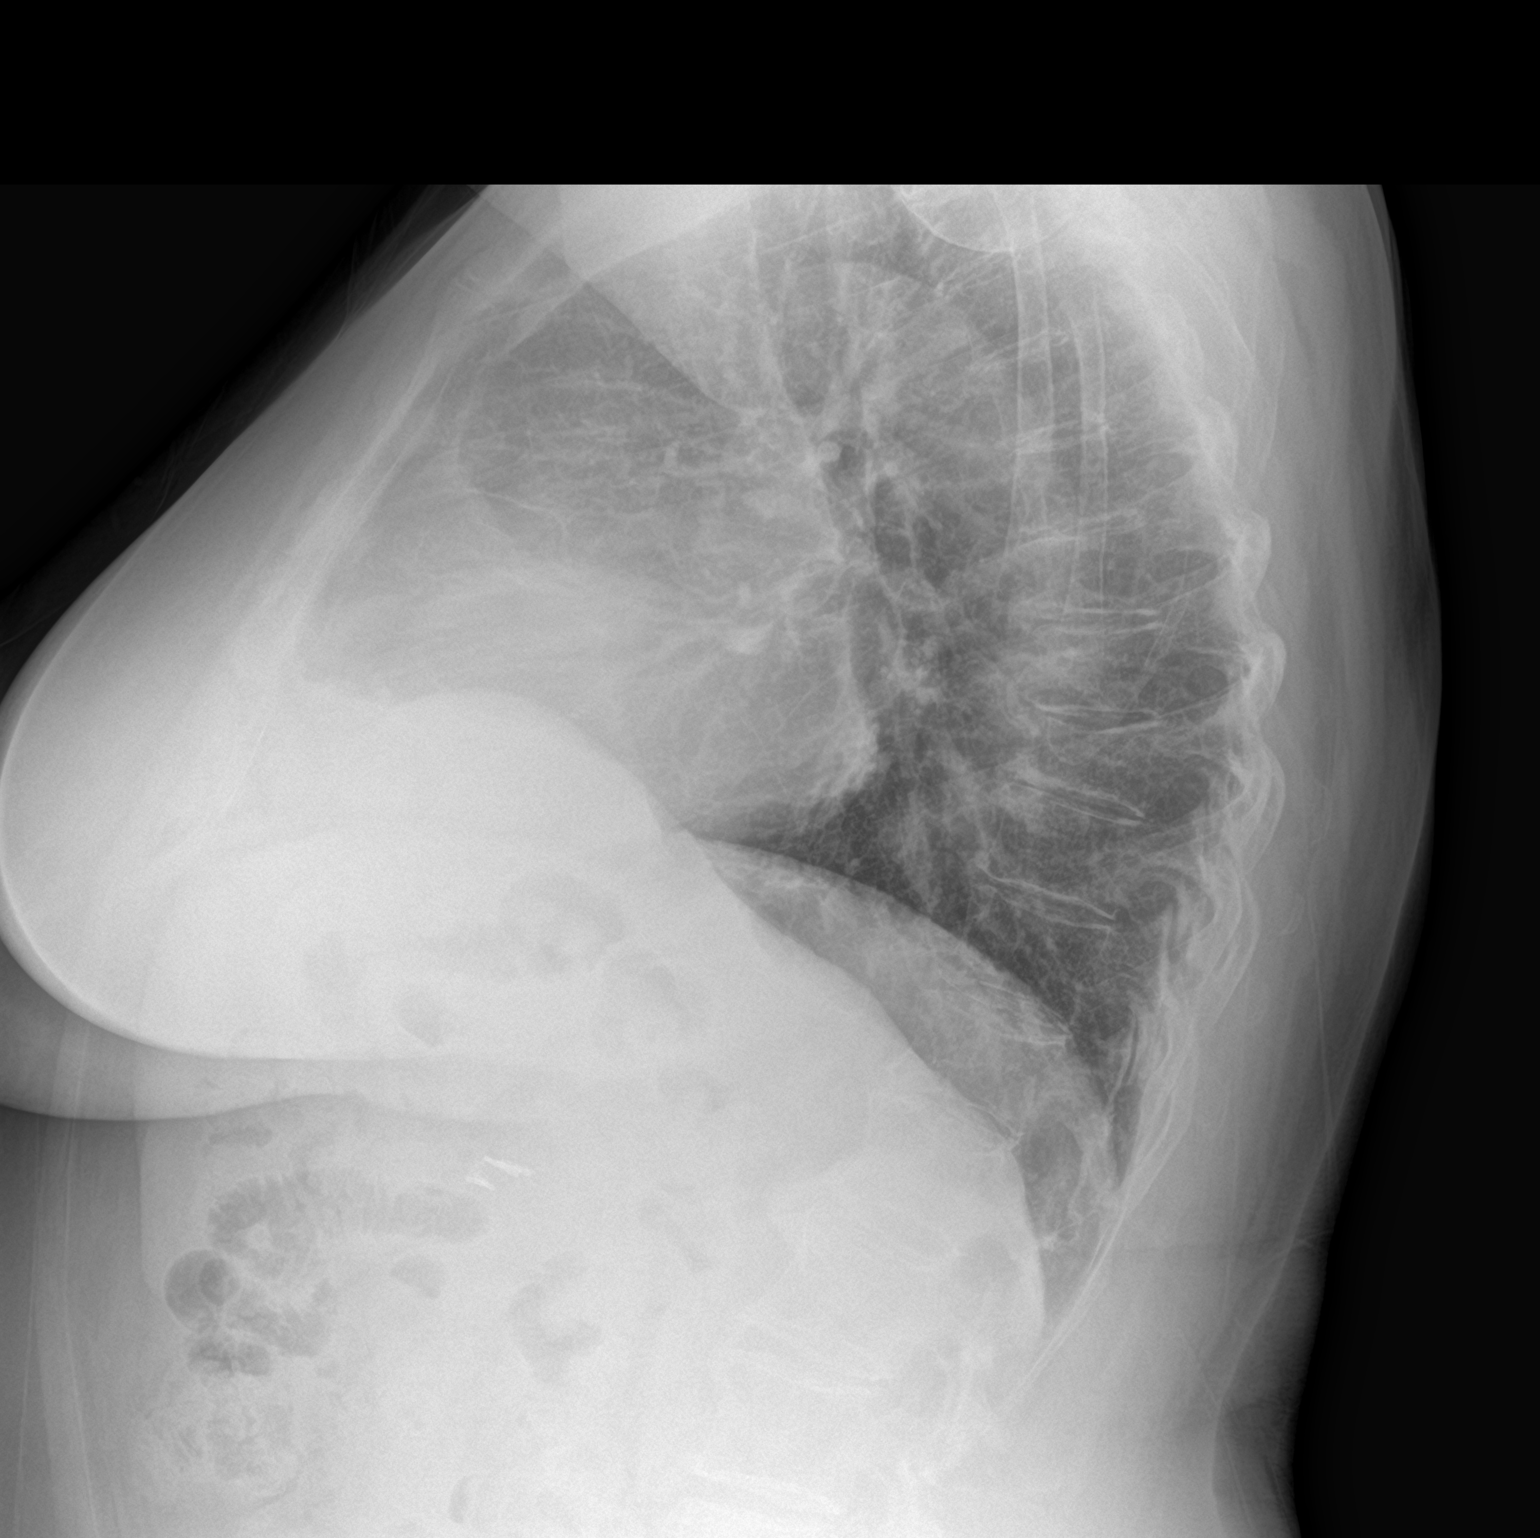

[2 of 2 positions shown; findings below may reference images not displayed]

FINDINGS: The heart size and mediastinal contours are within normal limits.
Both lungs are clear. The visualized skeletal structures are
unremarkable.
IMPRESSION: No active cardiopulmonary disease.
